# Patient Record
Sex: Male | Born: 1985 | Hispanic: No | Marital: Married | Smoking: Current every day smoker
Health system: Southern US, Community
[De-identification: ages and names within clinical notes are randomized; demographics above are authoritative.]

---

## 2014-10-13 ENCOUNTER — Ambulatory Visit: Payer: Medicaid Other | Attending: Family Medicine | Admitting: Family Medicine

## 2014-10-13 ENCOUNTER — Encounter: Payer: Self-pay | Admitting: Family Medicine

## 2014-10-13 VITALS — BP 112/76 | HR 72 | Temp 98.7°F | Resp 16 | Ht 71.0 in | Wt 117.0 lb

## 2014-10-13 DIAGNOSIS — F17210 Nicotine dependence, cigarettes, uncomplicated: Secondary | ICD-10-CM | POA: Diagnosis not present

## 2014-10-13 DIAGNOSIS — F17200 Nicotine dependence, unspecified, uncomplicated: Secondary | ICD-10-CM | POA: Insufficient documentation

## 2014-10-13 DIAGNOSIS — Z720 Tobacco use: Secondary | ICD-10-CM

## 2014-10-13 DIAGNOSIS — J209 Acute bronchitis, unspecified: Secondary | ICD-10-CM | POA: Diagnosis not present

## 2014-10-13 DIAGNOSIS — Z716 Tobacco abuse counseling: Secondary | ICD-10-CM | POA: Diagnosis not present

## 2014-10-13 DIAGNOSIS — R05 Cough: Secondary | ICD-10-CM | POA: Diagnosis present

## 2014-10-13 DIAGNOSIS — Z114 Encounter for screening for human immunodeficiency virus [HIV]: Secondary | ICD-10-CM | POA: Diagnosis not present

## 2014-10-13 DIAGNOSIS — Z113 Encounter for screening for infections with a predominantly sexual mode of transmission: Secondary | ICD-10-CM

## 2014-10-13 LAB — COMPLETE METABOLIC PANEL WITH GFR
ALT: 15 U/L (ref 0–53)
AST: 16 U/L (ref 0–37)
Albumin: 4.4 g/dL (ref 3.5–5.2)
Alkaline Phosphatase: 86 U/L (ref 39–117)
BUN: 12 mg/dL (ref 6–23)
CALCIUM: 9.2 mg/dL (ref 8.4–10.5)
CHLORIDE: 105 meq/L (ref 96–112)
CO2: 24 meq/L (ref 19–32)
Creat: 0.62 mg/dL (ref 0.50–1.35)
GFR, Est Non African American: >89 mL/min
Glucose, Bld: 91 mg/dL (ref 70–99)
POTASSIUM: 3.9 meq/L (ref 3.5–5.3)
Sodium: 139 mEq/L (ref 135–145)
TOTAL PROTEIN: 7.4 g/dL (ref 6.0–8.3)
Total Bilirubin: 0.4 mg/dL (ref 0.2–1.2)

## 2014-10-13 LAB — CBC
HCT: 40.9 % (ref 39.0–52.0)
Hemoglobin: 13.6 g/dL (ref 13.0–17.0)
MCH: 26.2 pg (ref 26.0–34.0)
MCHC: 33.3 g/dL (ref 30.0–36.0)
MCV: 78.7 fL (ref 78.0–100.0)
Platelets: 191 10*3/uL (ref 150–400)
RBC: 5.2 MIL/uL (ref 4.22–5.81)
RDW: 14.5 % (ref 11.5–15.5)
WBC: 6 10*3/uL (ref 4.0–10.5)

## 2014-10-13 MED ORDER — AZITHROMYCIN 500 MG PO TABS: 500.0000 mg | ORAL_TABLET | Freq: Every day | ORAL | Status: DC

## 2014-10-13 NOTE — Patient Instructions (Signed)
[name],  Thank you for coming in today. It was a pleasure meeting you. I look forward to being your primary doctor.  Please work on quitting smoking. Smoking cessation support: smoking cessation hotline: 1-800-QUIT-NOW.  Smoking cessation classes are available through [facility]. Call [phone] or visit our website at [email or web address].  Dr. Armen PickupFunches   You Can Quit Smoking If you are ready to quit smoking or are thinking about it, congratulations! You have chosen to help yourself be healthier and live longer! There are lots of different ways to quit smoking. Nicotine gum, nicotine patches, a nicotine inhaler, or nicotine nasal spray can help with physical craving. Hypnosis, support groups, and medicines help break the habit of smoking. TIPS TO GET OFF AND STAY OFF CIGARETTES  Learn to predict your moods. Do not let a bad situation be your excuse to have a cigarette. Some situations in your life might tempt you to have a cigarette.  Ask friends and co-workers not to smoke around you.  Make your home smoke-free.  Never have "just one" cigarette. It leads to wanting another and another. Remind yourself of your decision to quit.  On a card, make a list of your reasons for not smoking. Read it at least the same number of times a day as you have a cigarette. Tell yourself everyday, "I do not want to smoke. I choose not to smoke."  Ask someone at home or work to help you with your plan to quit smoking.  Have something planned after you eat or have a cup of coffee. Take a walk or get other exercise to perk you up. This will help to keep you from overeating.  Try a relaxation exercise to calm you down and decrease your stress. Remember, you may be tense and nervous the first two weeks after you quit. This will pass.  Find new activities to keep your hands busy. Play with a pen, coin, or rubber band. Doodle or draw things on paper.  Brush your teeth right  after eating. This will help cut down the craving for the taste of tobacco after meals. You can try mouthwash too.  Try gum, breath mints, or diet candy to keep something in your mouth. IF YOU SMOKE AND WANT TO QUIT:  Do not stock up on cigarettes. Never buy a carton. Wait until one pack is finished before you buy another.  Never carry cigarettes with you at work or at home.  Keep cigarettes as far away from you as possible. Leave them with someone else.  Never carry matches or a lighter with you.  Ask yourself, "Do I need this cigarette or is this just a reflex?"  Bet with someone that you can quit. Put cigarette money in a piggy bank every morning. If you smoke, you give up the money. If you do not smoke, by the end of the week, you keep the money.  Keep trying. It takes 21 days to change a habit!  Talk to your doctor about using medicines to help you quit. These include nicotine replacement gum, lozenges, or skin patches. Document Released: 09/16/2009 Document Revised: 02/12/2012 Document Reviewed: 09/16/2009 [facility] Patient Information 2015 [employer] [place]. This information is not intended to replace advice given to you by your health care provider. Make sure you discuss any questions you have with your health care provider.

## 2014-10-13 NOTE — Assessment & Plan Note (Signed)
A: desires to quit P: Smoking cessation counseling and resources given

## 2014-10-13 NOTE — Assessment & Plan Note (Signed)
A: acute bronchitis in smoker P: Azithromycin 500 mg x 5 days  Smoking cessation

## 2014-10-13 NOTE — Addendum Note (Signed)
Addended by: Dessa PhiFUNCHES, Chelsy Parrales on: 10/13/2014 11:04 AM   Modules accepted: Level of Service

## 2014-10-13 NOTE — Assessment & Plan Note (Signed)
Screening HIV  

## 2014-10-13 NOTE — Progress Notes (Signed)
   Subjective:    Patient ID: Terry Cantu, male    DOB: 08/13/1986, 28 y.o.   MRN: [id number] CC: cough  X 2 days  HPI 28 yo M from [place], refugee, Arabic interpreter used.  Cough: x 2 days. Productive. With SOB. No CP, fever. Wife is also sick. Patient smokes 1 PPD x 12 years.  Soc hx: smoker Fam hx: HTN mother Med hx: negative for HTN and DM2   Review of Systems As per HPI     Objective:   Physical Exam BP 112/76 mmHg  Pulse 72  Temp(Src) 98.7 F (37.1 C) (Oral)  Resp 16  Ht 5\' 11"  (1.803 m)  Wt 117 lb (53.071 kg)  BMI 16.33 kg/m2  SpO2 99% General appearance: alert, cooperative and no distress Ears: normal TM's and external ear canals both ears Nose: Nares normal. Septum midline. Mucosa normal. No drainage or sinus tenderness. Throat: lips, mucosa, and tongue normal; teeth and gums normal Neck: no adenopathy and thyroid not enlarged, symmetric, no tenderness/mass/nodules Lungs: clear to auscultation bilaterally Heart: regular rate and rhythm, S1, S2 normal, no murmur, click, rub or gallop  Albuterol: improved SOB symptom. Resulted in tingling and numbness      Assessment & Plan:

## 2014-10-13 NOTE — Progress Notes (Signed)
Establish Care Complaining of productive cough, x2 days Stated mucus white color

## 2014-10-14 ENCOUNTER — Telehealth: Payer: Self-pay | Admitting: *Deleted

## 2014-10-14 LAB — HIV ANTIBODY (ROUTINE TESTING W REFLEX): HIV 1&2 Ab, 4th Generation: NONREACTIVE

## 2014-10-14 NOTE — Telephone Encounter (Signed)
Used [employer] [facility] #[id number] [facility]  Left voice message with normal labs

## 2014-10-14 NOTE — Telephone Encounter (Signed)
-----   Message from Lora PaulaJosalyn C Funches, MD sent at 10/14/2014  9:17 AM EST ----- Normal CBC and CMP. Screening HIV negative.

## 2014-10-28 ENCOUNTER — Encounter: Payer: Self-pay | Admitting: Family Medicine

## 2014-10-28 ENCOUNTER — Ambulatory Visit: Payer: Medicaid Other | Attending: Family Medicine | Admitting: Family Medicine

## 2014-10-28 VITALS — BP 111/72 | HR 75 | Temp 97.7°F | Resp 16 | Ht 71.0 in | Wt 117.0 lb

## 2014-10-28 DIAGNOSIS — R634 Abnormal weight loss: Secondary | ICD-10-CM | POA: Insufficient documentation

## 2014-10-28 DIAGNOSIS — R760 Raised antibody titer: Secondary | ICD-10-CM

## 2014-10-28 DIAGNOSIS — R05 Cough: Secondary | ICD-10-CM | POA: Diagnosis present

## 2014-10-28 DIAGNOSIS — Z720 Tobacco use: Secondary | ICD-10-CM

## 2014-10-28 DIAGNOSIS — R768 Other specified abnormal immunological findings in serum: Secondary | ICD-10-CM

## 2014-10-28 DIAGNOSIS — J209 Acute bronchitis, unspecified: Secondary | ICD-10-CM | POA: Diagnosis not present

## 2014-10-28 DIAGNOSIS — Z681 Body mass index (BMI) 19 or less, adult: Secondary | ICD-10-CM | POA: Insufficient documentation

## 2014-10-28 DIAGNOSIS — F17200 Nicotine dependence, unspecified, uncomplicated: Secondary | ICD-10-CM

## 2014-10-28 DIAGNOSIS — Z114 Encounter for screening for human immunodeficiency virus [HIV]: Secondary | ICD-10-CM | POA: Diagnosis not present

## 2014-10-28 DIAGNOSIS — J3489 Other specified disorders of nose and nasal sinuses: Secondary | ICD-10-CM

## 2014-10-28 DIAGNOSIS — Z113 Encounter for screening for infections with a predominantly sexual mode of transmission: Secondary | ICD-10-CM

## 2014-10-28 DIAGNOSIS — R1310 Dysphagia, unspecified: Secondary | ICD-10-CM | POA: Insufficient documentation

## 2014-10-28 DIAGNOSIS — F17210 Nicotine dependence, cigarettes, uncomplicated: Secondary | ICD-10-CM | POA: Diagnosis not present

## 2014-10-28 DIAGNOSIS — R636 Underweight: Secondary | ICD-10-CM

## 2014-10-28 DIAGNOSIS — K220 Achalasia of cardia: Secondary | ICD-10-CM | POA: Insufficient documentation

## 2014-10-28 NOTE — Assessment & Plan Note (Signed)
HIV negative

## 2014-10-28 NOTE — Assessment & Plan Note (Signed)
A: resolved P:  Will resolve problem

## 2014-10-28 NOTE — Progress Notes (Signed)
   Subjective:    Patient ID: Terry Cantu, male    DOB: 10/06/1986, 28 y.o.   MRN: [id number] CC: persistent cough request ENT referral  HPI 28 yo M presents for f/u;  1. Trouble swallowing: x 10 years. With 22#  (10 kg) weight loss. Trouble swallowing solids and liquids. Swallowing is not painful. No fever. Patient has been smoking. Get occasional heartburn and reflux symptoms.   Soc Hx: still smoking down to 1/2 PPD  Review of Systems As per HPI  Nasal stuffiness Runny nose at night    Objective:   Physical Exam BP 111/72 mmHg  Pulse 75  Temp(Src) 97.7 F (36.5 C) (Oral)  Resp 16  Ht 5\' 11"  (1.803 m)  Wt 117 lb (53.071 kg)  BMI 16.33 kg/m2  SpO2 100%  Wt Readings from Last 3 Encounters:  10/28/14 117 lb (53.071 kg)  10/13/14 117 lb (53.071 kg)  General appearance: alert, cooperative and no distress  Ears: normal TM's and external ear canals both ears Nose: no discharge Throat: normal oropharynx, poor dentition  Neck: no adenopathy and thyroid not enlarged, symmetric, no tenderness/mass/nodules Lungs: clear to auscultation bilaterally Heart: regular rate and rhythm, S1, S2 normal, no murmur, click, rub or gallop Extremities: extremities normal, atraumatic, no cyanosis or edema       Assessment & Plan:

## 2014-10-28 NOTE — Assessment & Plan Note (Signed)
A; still smoking  P: smoking cessation encouraged

## 2014-10-28 NOTE — Progress Notes (Signed)
Requested Referral for ENT Stated medicine for cough did not work  Complaining back pain, pain stated 3 years ago No Hx of injury  Pt was given lab results.

## 2014-10-28 NOTE — Patient Instructions (Signed)
Mr. Barb MerinoMahmood,  We will work up your weight loss and trouble swallowing. You will be called with lab results.  I have placed a referral to ENT.  Start ensure or carnation instant breakfast drink between meals.  Smoking cessation support: smoking cessation hotline: 1-800-QUIT-NOW.  Smoking cessation classes are available through [facility]. Call [phone] or visit our website at [email or web address].   Dr. Armen PickupFunches

## 2014-10-28 NOTE — Assessment & Plan Note (Signed)
A: chronic odynophagia P: Barium swallow Smoking cessation  Nutrition supplement  ENT referral

## 2014-10-29 LAB — TSH: TSH: 0.459 u[IU]/mL (ref 0.350–4.500)

## 2014-11-04 LAB — HELICOBACTER PYLORI ABS-IGG+IGA, BLD
H Pylori IgG: >8 {ISR} — ABNORMAL HIGH
HELICOBACTER PYLORI AB, IGA: 8.9 U/mL (ref <9.0)

## 2014-11-06 ENCOUNTER — Ambulatory Visit (HOSPITAL_COMMUNITY): Payer: Medicaid Other

## 2014-11-10 ENCOUNTER — Ambulatory Visit (HOSPITAL_COMMUNITY)
Admission: RE | Admit: 2014-11-10 | Discharge: 2014-11-10 | Disposition: A | Discharge status: Home / Self Care | Payer: Medicaid Other | Source: Ambulatory Visit | Attending: Family Medicine | Admitting: Family Medicine

## 2014-11-10 ENCOUNTER — Telehealth: Payer: Self-pay | Admitting: Family Medicine

## 2014-11-10 DIAGNOSIS — R1013 Epigastric pain: Secondary | ICD-10-CM | POA: Diagnosis not present

## 2014-11-10 DIAGNOSIS — R12 Heartburn: Secondary | ICD-10-CM | POA: Insufficient documentation

## 2014-11-10 DIAGNOSIS — R7689 Other specified abnormal immunological findings in serum: Secondary | ICD-10-CM | POA: Insufficient documentation

## 2014-11-10 DIAGNOSIS — R768 Other specified abnormal immunological findings in serum: Secondary | ICD-10-CM

## 2014-11-10 DIAGNOSIS — K220 Achalasia of cardia: Secondary | ICD-10-CM

## 2014-11-10 DIAGNOSIS — R1310 Dysphagia, unspecified: Secondary | ICD-10-CM | POA: Diagnosis present

## 2014-11-10 MED ORDER — AMOXICILLIN 500 MG PO CAPS: 1000.0000 mg | ORAL_CAPSULE | Freq: Two times a day (BID) | ORAL | Status: DC

## 2014-11-10 MED ORDER — CLARITHROMYCIN 500 MG PO TABS: 500.0000 mg | ORAL_TABLET | Freq: Two times a day (BID) | ORAL | Status: DC

## 2014-11-10 MED ORDER — OMEPRAZOLE 20 MG PO CPDR: 20.0000 mg | DELAYED_RELEASE_CAPSULE | Freq: Two times a day (BID) | ORAL | Status: DC

## 2014-11-10 NOTE — Assessment & Plan Note (Signed)
A: severe Achlasia with weight loss on EGD P:  GI referral for treatment

## 2014-11-10 NOTE — Telephone Encounter (Signed)
Hey stella, Please call results of barium swallow (Achlasia)  As we discussed, I have placed a GI referral. I have cancelled the Rxs for H. Pylori as I do not believe he has active infection.

## 2014-11-10 NOTE — Assessment & Plan Note (Signed)
A: H pylor IgG positive however this is not specific for active acute infection and patient has achalasia confirmed by barium swallow P: GI referral for EGD Hold off on treatment of H pylori

## 2014-11-10 NOTE — Assessment & Plan Note (Addendum)
Elevated H. pylori IgG in the setting of odnynophagia and weight loss.  plan to treat for active H. Pylori infection with triple therapy. PPI, amoxicillin, clarithromycin, sent to pharmacy.

## 2014-11-10 NOTE — Addendum Note (Signed)
Addended by: Dessa PhiFUNCHES, Dannell Gortney on: 11/10/2014 08:22 AM   Modules accepted: Orders

## 2014-11-11 NOTE — Telephone Encounter (Signed)
[facility]  11/11/14 as Urgent

## 2014-11-19 ENCOUNTER — Other Ambulatory Visit: Payer: Self-pay | Admitting: Family Medicine

## 2014-11-19 ENCOUNTER — Telehealth: Payer: Self-pay | Admitting: *Deleted

## 2014-11-19 DIAGNOSIS — J3489 Other specified disorders of nose and nasal sinuses: Secondary | ICD-10-CM

## 2014-11-19 DIAGNOSIS — J343 Hypertrophy of nasal turbinates: Secondary | ICD-10-CM

## 2014-11-19 MED ORDER — FLUTICASONE PROPIONATE 50 MCG/ACT NA SUSP: 2.0000 | Freq: Every day | NASAL | Status: AC

## 2014-11-19 NOTE — Telephone Encounter (Signed)
Left message, pt was referred  to GI, to return call for Barium swallow results

## 2014-12-11 ENCOUNTER — Telehealth: Payer: Self-pay

## 2014-12-11 NOTE — Telephone Encounter (Signed)
Returned patient phone call Patient was at work and stated he needed to call us back

## 2014-12-15 ENCOUNTER — Ambulatory Visit: Payer: Medicaid Other | Admitting: Family Medicine

## 2015-05-28 IMAGING — RF DG ESOPHAGUS
7 of 10 series · 14 of 24 positions shown · non-contrast
Comparison: None.

CLINICAL DATA: 28-year-old with chronic dysphagia. Epigastric pain
and heartburn. Initial encounter.

EXAM:
ESOPHOGRAM/BARIUM SWALLOW
TECHNIQUE: Combined double contrast and single contrast examination performed
using thick and thin barium liquid.
FLUOROSCOPY TIME:  48 seconds

[Series 1: cp_standard · 0.56mm/px · 2 of 32 frames shown (1 of 7)]
[frame 5/32]
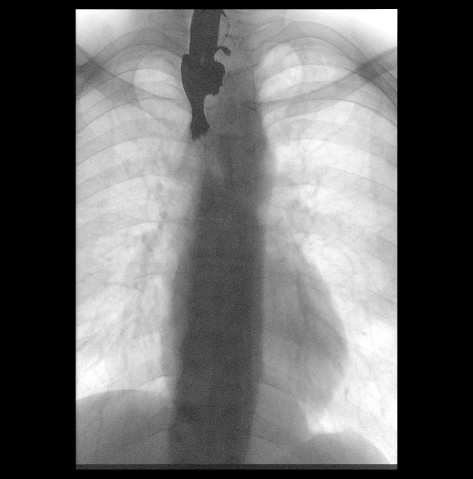
[frame 17/32]
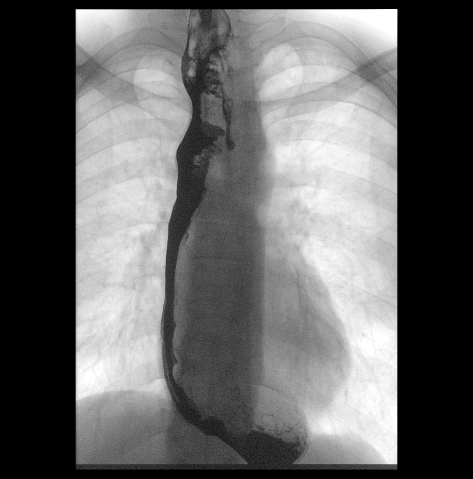

[Series 3: cp_standard · 0.56mm/px · 3 of 63 frames shown (2 of 7)]
[frame 10/63]
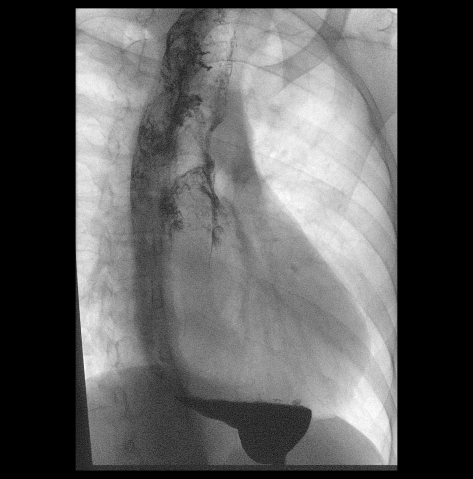
[frame 42/63]
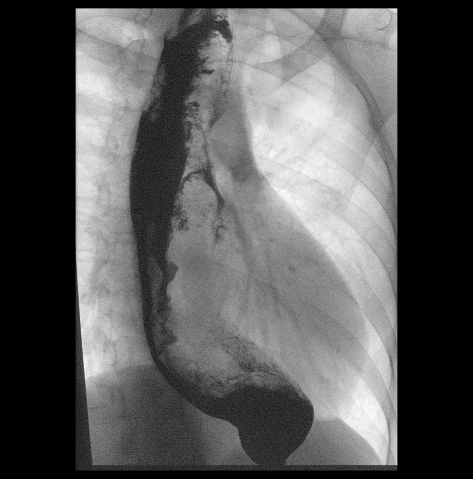
[frame 54/63]
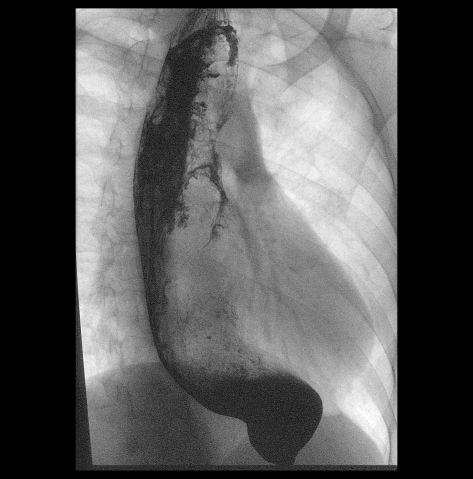

[Series 4: cp_standard · 0.38mm/px · 2 of 13 frames shown (3 of 7)]
[frame 7/13]
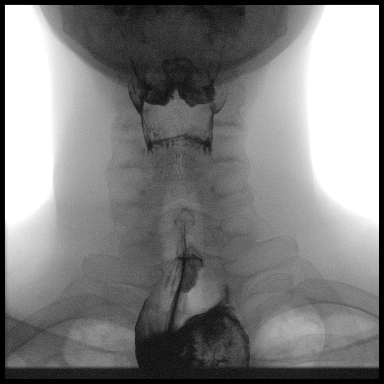
[frame 12/13]
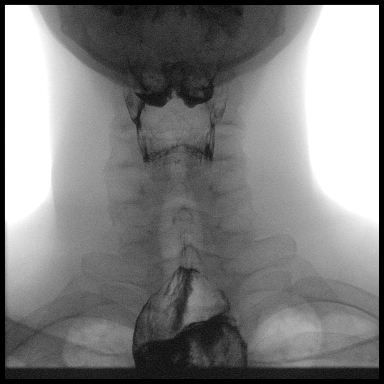

[Series 5: cp_standard · 0.38mm/px · 2 of 45 frames shown (4 of 7)]
[frame 7/45]
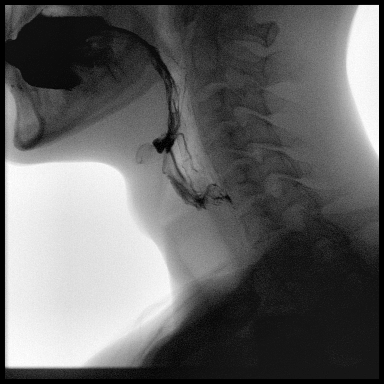
[frame 39/45]
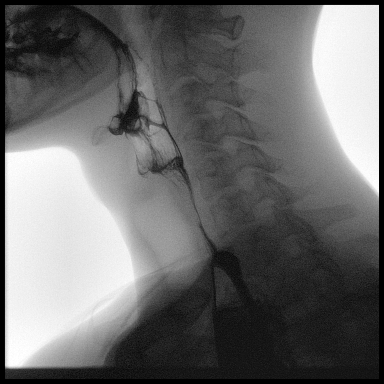

[Series 7: cp_standard · 0.42mm/px · 3 of 46 frames shown (5 of 7)]
[frame 7/46]
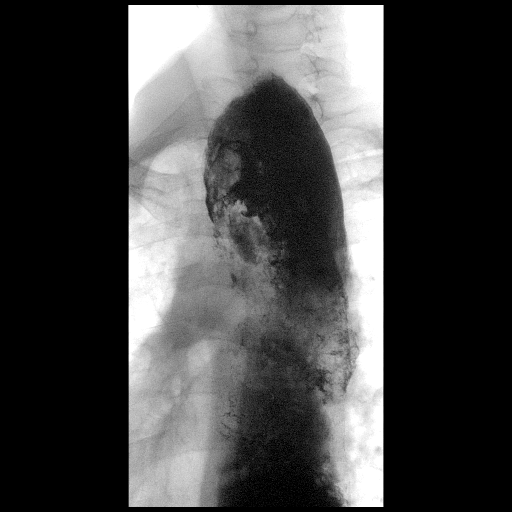
[frame 26/46]
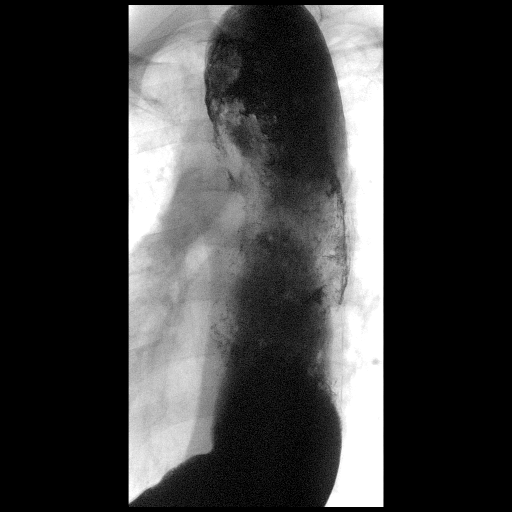
[frame 40/46]
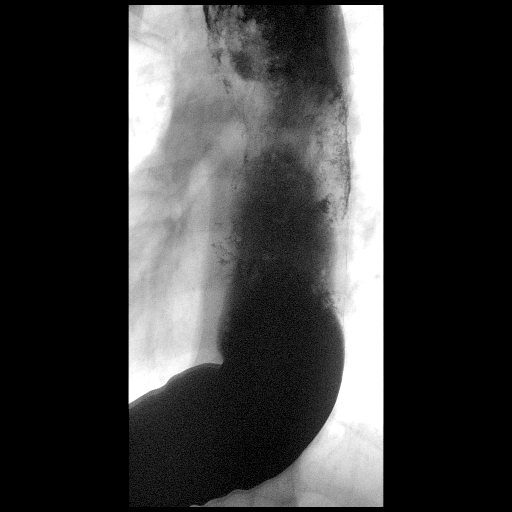

[Series 10: cp_standard · 0.22mm/px · 1 of 1 slices shown (6 of 7)]
[im 1/1]
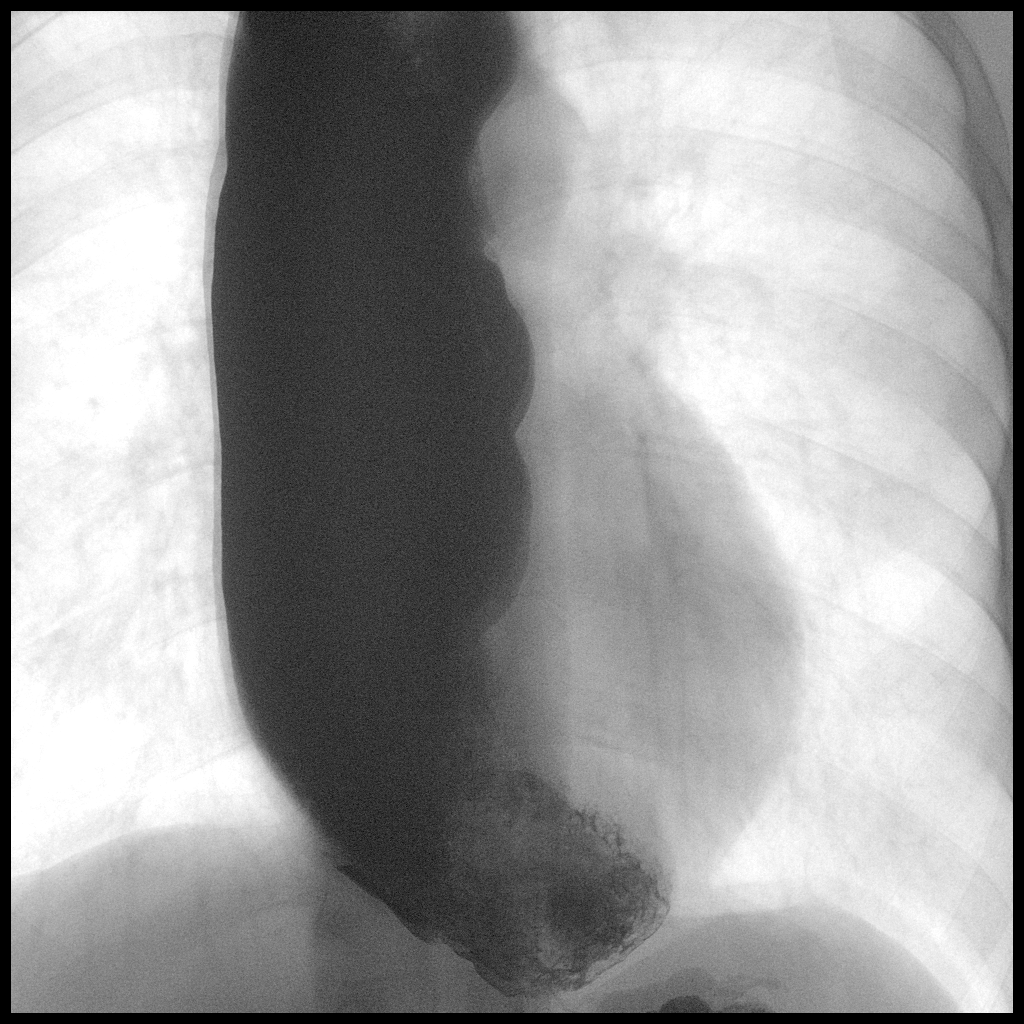

[Series 12: cp_standard · 0.33mm/px · 1 of 1 slices shown (7 of 7)]
[im 1/1]
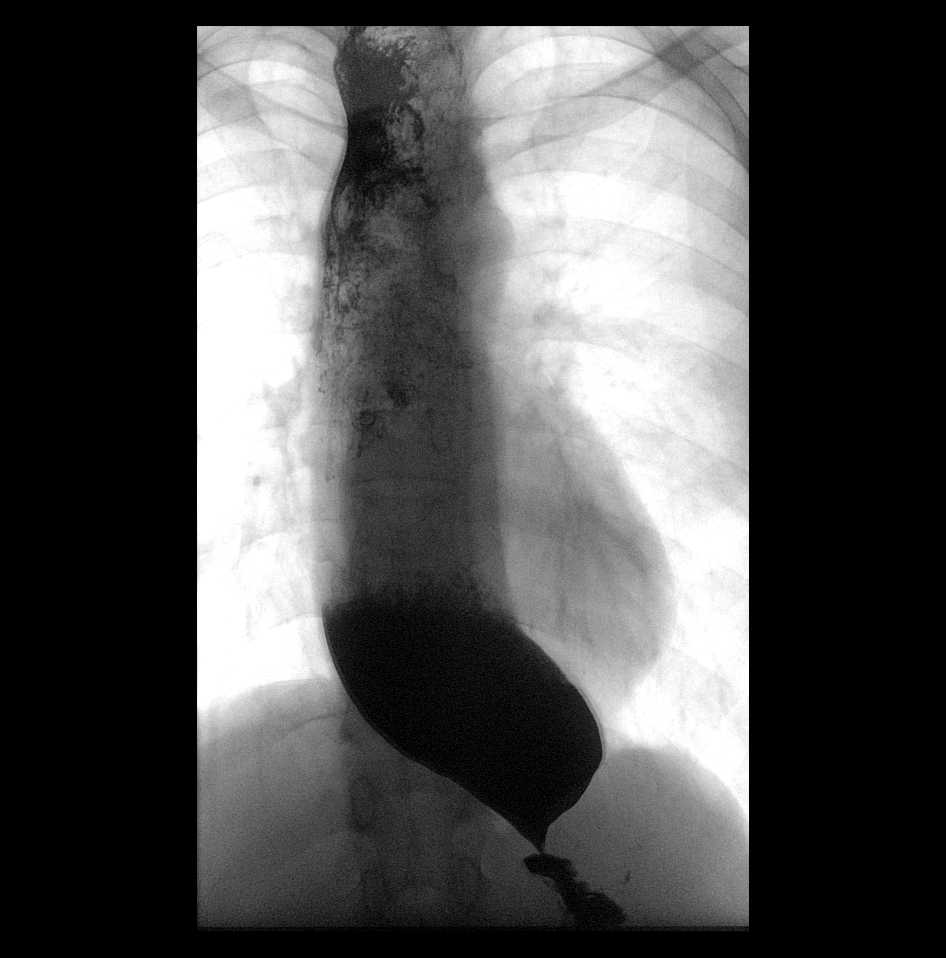

[14 of 24 positions shown; findings below may reference images not displayed]

FINDINGS: The study was initiated with the patient erect. The patient
swallowed the contrast without difficulty. The esophagus is
distended with retained ingested material. The esophageal lumen
measures up to 7 cm in diameter and demonstrates a decreased primary
stripping wave. There is beak-like narrowing of the distal
esophagus. Some contrast did pass into the stomach.

Rapid sequence imaging of the pharynx in the AP and lateral
projections demonstrates normal motility, no mucosal abnormality or
laryngeal penetration.

A barium tablet was not administered due to retention of the barium
in the esophagus.
IMPRESSION: Findings are consistent with severe achalasia.
